# Patient Record
Sex: Female | Born: 1980 | Race: White | Hispanic: No | Marital: Married | Smoking: Former smoker
Health system: Southern US, Community
[De-identification: ages and names within clinical notes are randomized; demographics above are authoritative.]

## PROBLEM LIST (undated history)

## (undated) HISTORY — PX: KNEE ARTHROPLASTY: SHX992

---

## 2006-12-16 ENCOUNTER — Emergency Department (HOSPITAL_COMMUNITY): Admission: EM | Admit: 2006-12-16 | Discharge: 2006-12-17 | Payer: Self-pay | Admitting: Emergency Medicine

## 2009-02-12 ENCOUNTER — Ambulatory Visit: Payer: Self-pay | Admitting: Radiology

## 2009-02-12 ENCOUNTER — Emergency Department (HOSPITAL_BASED_OUTPATIENT_CLINIC_OR_DEPARTMENT_OTHER): Admission: EM | Admit: 2009-02-12 | Discharge: 2009-02-12 | Payer: Self-pay | Admitting: Emergency Medicine

## 2010-03-26 ENCOUNTER — Emergency Department (HOSPITAL_BASED_OUTPATIENT_CLINIC_OR_DEPARTMENT_OTHER): Admission: EM | Admit: 2010-03-26 | Discharge: 2010-03-26 | Payer: Self-pay | Admitting: Emergency Medicine

## 2010-03-26 ENCOUNTER — Ambulatory Visit: Payer: Self-pay | Admitting: Diagnostic Radiology

## 2010-04-14 ENCOUNTER — Ambulatory Visit: Payer: Self-pay | Admitting: Radiology

## 2010-04-14 ENCOUNTER — Emergency Department (HOSPITAL_BASED_OUTPATIENT_CLINIC_OR_DEPARTMENT_OTHER): Admission: EM | Admit: 2010-04-14 | Discharge: 2010-04-14 | Payer: Self-pay | Admitting: Emergency Medicine

## 2011-07-17 ENCOUNTER — Emergency Department (INDEPENDENT_AMBULATORY_CARE_PROVIDER_SITE_OTHER): Payer: Medicaid Other

## 2011-07-17 ENCOUNTER — Encounter: Payer: Self-pay | Admitting: *Deleted

## 2011-07-17 ENCOUNTER — Emergency Department (HOSPITAL_BASED_OUTPATIENT_CLINIC_OR_DEPARTMENT_OTHER)
Admission: EM | Admit: 2011-07-17 | Discharge: 2011-07-17 | Disposition: A | Discharge status: Home / Self Care | Payer: Medicaid Other | Attending: Emergency Medicine | Admitting: Emergency Medicine

## 2011-07-17 DIAGNOSIS — W19XXXA Unspecified fall, initial encounter: Secondary | ICD-10-CM

## 2011-07-17 DIAGNOSIS — IMO0002 Reserved for concepts with insufficient information to code with codable children: Secondary | ICD-10-CM | POA: Insufficient documentation

## 2011-07-17 DIAGNOSIS — M25519 Pain in unspecified shoulder: Secondary | ICD-10-CM

## 2011-07-17 DIAGNOSIS — S46919A Strain of unspecified muscle, fascia and tendon at shoulder and upper arm level, unspecified arm, initial encounter: Secondary | ICD-10-CM

## 2011-07-17 DIAGNOSIS — Y9239 Other specified sports and athletic area as the place of occurrence of the external cause: Secondary | ICD-10-CM | POA: Insufficient documentation

## 2011-07-17 DIAGNOSIS — Y92838 Other recreation area as the place of occurrence of the external cause: Secondary | ICD-10-CM | POA: Insufficient documentation

## 2011-07-17 DIAGNOSIS — Y9351 Activity, roller skating (inline) and skateboarding: Secondary | ICD-10-CM | POA: Insufficient documentation

## 2011-07-17 NOTE — ED Provider Notes (Signed)
History     CSN: [id number] Arrival date & time: 07/17/2011  9:23 PM  Chief Complaint  Patient presents with  . Shoulder Injury   Patient is a 30 y.o. female presenting with shoulder injury. The history is provided by the patient.  Shoulder Injury  Pt was skating in a roller derby just prior to arrival when she fell over and landed on her R shoulder. Heard a pop, complaining of moderate aching pain in R shoulder, worse with movement. No head injury or LOC. Pain is moderate and aching.   History reviewed. No pertinent past medical history.  Past Surgical History  Procedure Date  . Cesarean section     History reviewed. No pertinent family history.  History  Substance Use Topics  . Smoking status: Current Everyday Smoker  . Smokeless tobacco: Not on file  . Alcohol Use: No    OB History    Grav Para Term Preterm Abortions TAB SAB Ect Mult Living                  Review of Systems All other systems reviewed and are negative except as noted in HPI.   Physical Exam  BP 125/73  Pulse 68  Temp(Src) 97.8 F (36.6 C) (Oral)  Resp 20  Ht 5\' 8"  (1.727 m)  Wt 272 lb (123.378 kg)  BMI 41.36 kg/m2  SpO2 100%  LMP 07/17/2011  Physical Exam  Nursing note and vitals reviewed. Constitutional: She is oriented to person, place, and time. She appears well-developed and well-nourished.  HENT:  Head: Normocephalic and atraumatic.  Eyes: EOM are normal. Pupils are equal, round, and reactive to light.  Neck: Normal range of motion. Neck supple.  Cardiovascular: Normal rate, normal heart sounds and intact distal pulses.   Pulmonary/Chest: Effort normal and breath sounds normal.  Abdominal: Bowel sounds are normal. She exhibits no distension. There is no tenderness.  Musculoskeletal: She exhibits tenderness. She exhibits no edema.       Tender over the R shoulder soft tissue, no focal bony or AC joint tenderness, no deformity, ROM limited by pain  Neurological: She is alert and  oriented to person, place, and time. No cranial nerve deficit.  Skin: Skin is warm and dry. No rash noted.  Psychiatric: She has a normal mood and affect.    ED Course  Procedures  MDM Xray neg for bony injury. Sling applied by nurse, pt declines pain meds. Advised ortho followup if not improving.       Mazzie Brodrick B. Bernette Mayers, MD 07/17/11 [phone]

## 2011-07-17 NOTE — ED Notes (Signed)
Pt states she participates on a roller derby team and was tripped, landing on her right shoulder. Felt pop. +radial pulse. Ice applied at time of incident.

## 2011-10-27 ENCOUNTER — Emergency Department (INDEPENDENT_AMBULATORY_CARE_PROVIDER_SITE_OTHER): Payer: Medicaid Other

## 2011-10-27 ENCOUNTER — Encounter (HOSPITAL_BASED_OUTPATIENT_CLINIC_OR_DEPARTMENT_OTHER): Payer: Self-pay | Admitting: *Deleted

## 2011-10-27 ENCOUNTER — Emergency Department (HOSPITAL_BASED_OUTPATIENT_CLINIC_OR_DEPARTMENT_OTHER)
Admission: EM | Admit: 2011-10-27 | Discharge: 2011-10-27 | Disposition: A | Discharge status: Home / Self Care | Payer: Medicaid Other | Attending: Emergency Medicine | Admitting: Emergency Medicine

## 2011-10-27 DIAGNOSIS — R05 Cough: Secondary | ICD-10-CM

## 2011-10-27 DIAGNOSIS — F17200 Nicotine dependence, unspecified, uncomplicated: Secondary | ICD-10-CM | POA: Insufficient documentation

## 2011-10-27 DIAGNOSIS — R059 Cough, unspecified: Secondary | ICD-10-CM | POA: Insufficient documentation

## 2011-10-27 MED ORDER — HYDROCOD POLST-CHLORPHEN POLST 10-8 MG/5ML PO LQCR: 5.0000 mL | Freq: Two times a day (BID) | ORAL | Status: DC | PRN

## 2011-10-27 NOTE — ED Notes (Signed)
Patient transported to X-ray 

## 2011-10-27 NOTE — ED Notes (Signed)
Secondary assessment-  Pt reports a 2 week history of coughing.  Pt reports a productive cough that has worsened over the past few days.  Denies fever or other symptoms.

## 2011-10-27 NOTE — ED Notes (Signed)
Pt c/o cough x1 week productive of yellow sputum. Pt denies fever.

## 2011-10-27 NOTE — ED Provider Notes (Signed)
History     CSN: [id number] Arrival date & time: 10/27/2011  3:06 PM   First MD Initiated Contact with Patient 10/27/11 1513      Chief Complaint  Patient presents with  . Cough    (Consider location/radiation/quality/duration/timing/severity/associated sxs/prior treatment) Patient is a 30 y.o. female presenting with cough. The history is provided by the patient.  Cough This is a new problem. The current episode started more than 1 week ago. The problem occurs constantly. The problem has not changed since onset.The cough is productive of sputum. There has been no fever. Associated symptoms include rhinorrhea and wheezing. Pertinent negatives include no sore throat and no shortness of breath. She has tried cough syrup for the symptoms. She is not a smoker. Her past medical history is significant for pneumonia.    History reviewed. No pertinent past medical history.  Past Surgical History  Procedure Date  . Cesarean section     No family history on file.  History  Substance Use Topics  . Smoking status: Current Everyday Smoker  . Smokeless tobacco: Not on file  . Alcohol Use: No    OB History    Grav Para Term Preterm Abortions TAB SAB Ect Mult Living                  Review of Systems  HENT: Positive for rhinorrhea. Negative for sore throat.   Respiratory: Positive for cough and wheezing. Negative for shortness of breath.   All other systems reviewed and are negative.    Allergies  Review of patient's allergies indicates no known allergies.  Home Medications   Current Outpatient Rx  Name Route Sig Dispense Refill  . HYDROCOD POLST-CHLORPHEN POLST 10-8 MG/5ML PO LQCR Oral Take 5 mLs by mouth every 12 (twelve) hours as needed. 140 mL 0  . IBUPROFEN 200 MG PO TABS Oral Take 600 mg by mouth once as needed. For pain       BP 125/78  Pulse 68  Temp(Src) 98.3 F (36.8 C) (Oral)  Resp 20  SpO2 100%  LMP 10/13/2011  Physical Exam  Vitals  reviewed. Constitutional: She is oriented to person, place, and time. She appears well-developed and well-nourished.  HENT:  Right Ear: External ear normal.  Left Ear: External ear normal.  Mouth/Throat: Oropharynx is clear and moist.  Cardiovascular: Normal rate and regular rhythm.   Pulmonary/Chest: Effort normal and breath sounds normal.  Neurological: She is alert and oriented to person, place, and time.  Skin: Skin is warm and dry.  Psychiatric: She has a normal mood and affect.    ED Course  Procedures (including critical care time)  Labs Reviewed - No data to display Dg Chest 2 View  10/27/2011  *RADIOLOGY REPORT*  Clinical Data: Productive cough  CHEST - 2 VIEW  Comparison: 02/12/2009  Findings: Heart size is upper normal.  Negative for heart failure. Lungs are clear without infiltrate or effusion.  Negative for mass or pneumonia.  IMPRESSION: No active cardiopulmonary disease.  Original Report Authenticated By: Camelia Phenes, M.D.     1. Cough       MDM  No sign of infection;will treat symptomatically        Teressa Lower, NP 10/27/11 1550

## 2011-10-28 NOTE — ED Provider Notes (Signed)
Medical screening examination/treatment/procedure(s) were performed by non-physician practitioner and as supervising physician I was immediately available for consultation/collaboration.   Solita Macadam, MD 10/28/11 0004 

## 2014-06-26 ENCOUNTER — Encounter (HOSPITAL_BASED_OUTPATIENT_CLINIC_OR_DEPARTMENT_OTHER): Payer: Self-pay | Admitting: Emergency Medicine

## 2014-06-26 ENCOUNTER — Emergency Department (HOSPITAL_BASED_OUTPATIENT_CLINIC_OR_DEPARTMENT_OTHER)
Admission: EM | Admit: 2014-06-26 | Discharge: 2014-06-26 | Disposition: A | Discharge status: Home / Self Care | Payer: BC Managed Care – PPO | Attending: Emergency Medicine | Admitting: Emergency Medicine

## 2014-06-26 DIAGNOSIS — M25562 Pain in left knee: Secondary | ICD-10-CM

## 2014-06-26 DIAGNOSIS — Y9289 Other specified places as the place of occurrence of the external cause: Secondary | ICD-10-CM | POA: Insufficient documentation

## 2014-06-26 DIAGNOSIS — F17200 Nicotine dependence, unspecified, uncomplicated: Secondary | ICD-10-CM | POA: Insufficient documentation

## 2014-06-26 DIAGNOSIS — M7040 Prepatellar bursitis, unspecified knee: Secondary | ICD-10-CM | POA: Insufficient documentation

## 2014-06-26 DIAGNOSIS — S99919A Unspecified injury of unspecified ankle, initial encounter: Principal | ICD-10-CM

## 2014-06-26 DIAGNOSIS — Y9389 Activity, other specified: Secondary | ICD-10-CM | POA: Insufficient documentation

## 2014-06-26 DIAGNOSIS — R296 Repeated falls: Secondary | ICD-10-CM | POA: Insufficient documentation

## 2014-06-26 DIAGNOSIS — M7042 Prepatellar bursitis, left knee: Secondary | ICD-10-CM

## 2014-06-26 DIAGNOSIS — S99929A Unspecified injury of unspecified foot, initial encounter: Principal | ICD-10-CM

## 2014-06-26 DIAGNOSIS — S8990XA Unspecified injury of unspecified lower leg, initial encounter: Secondary | ICD-10-CM | POA: Insufficient documentation

## 2014-06-26 NOTE — ED Notes (Signed)
MD at bedside. 

## 2014-06-26 NOTE — ED Provider Notes (Signed)
CSN: [id number]     Arrival date & time 06/26/14  1046 History   First MD Initiated Contact with Patient 06/26/14 1103     Chief Complaint  Patient presents with  . Knee Pain     (Consider location/radiation/quality/duration/timing/severity/associated sxs/prior Treatment) HPI Comments: Patient is a 33 year old female who presents with left knee pain. This started 1 week ago after a fall. It is worse when she lies down, however she has no discomfort while walking.  Patient is a 33 y.o. female presenting with knee pain. The history is provided by the patient.  Knee Pain Location:  Knee Time since incident:  7 days Injury: yes   Knee location:  L knee Pain details:    Quality:  Sharp   Radiates to:  Does not radiate   Severity:  Moderate   Onset quality:  Sudden   Timing:  Constant   Progression:  Unchanged Chronicity:  New Relieved by:  NSAIDs Exacerbated by: Direct pressure.   History reviewed. No pertinent past medical history. Past Surgical History  Procedure Laterality Date  . Cesarean section     No family history on file. History  Substance Use Topics  . Smoking status: Current Every Day Smoker  . Smokeless tobacco: Not on file  . Alcohol Use: No   OB History   Grav Para Term Preterm Abortions TAB SAB Ect Mult Living                 Review of Systems  All other systems reviewed and are negative.     Allergies  Review of patient's allergies indicates no known allergies.  Home Medications   Prior to Admission medications   Medication Sig Start Date End Date Taking? Authorizing Provider  chlorpheniramine-HYDROcodone (TUSSIONEX PENNKINETIC ER) 10-8 MG/5ML LQCR Take 5 mLs by mouth every 12 (twelve) hours as needed. 10/27/11   Teressa LowerVrinda Pickering, NP  ibuprofen (ADVIL,MOTRIN) 200 MG tablet Take 600 mg by mouth once as needed. For pain     Historical Provider, MD   BP 128/77  Pulse 56  Temp(Src) 98.8 F (37.1 C) (Oral)  Resp 16  Ht 5\' 8"  (1.727 m)  Wt  300 lb (136.079 kg)  BMI 45.63 kg/m2  SpO2 100%  LMP 06/10/2014 Physical Exam  Nursing note and vitals reviewed. Constitutional: She is oriented to person, place, and time. She appears well-developed and well-nourished. No distress.  HENT:  Head: Normocephalic and atraumatic.  Musculoskeletal: Normal range of motion.  The left knee appears grossly normal. There is slight swelling anterior to the patella. She has full range of motion without significant discomfort. There is no laxity anterior/posterior or laterally.  Neurological: She is alert and oriented to person, place, and time.  Skin: Skin is warm and dry. She is not diaphoretic.    ED Course  Procedures (including critical care time) Labs Review Labs Reviewed - No data to display  Imaging Review No results found.   EKG Interpretation None      MDM   Final diagnoses:  None    This appears to be a prepatellar bursitis which will be treated with compressive wrap, NSAIDs, and rest. If not improving in the next week, she is to followup with her primary Dr.    Geoffery Lyonsouglas Chisa Kushner, MD 06/26/14 [phone]

## 2014-06-26 NOTE — Discharge Instructions (Signed)
Ibuprofen 600 mg 3 times daily for the next 5 days.  Wear Ace bandages applied.  Followup with your primary Dr. if not improving in the next week, and return to the ER if you develop severe pain, redness, high fever, or other new and concerning symptoms.   Bursitis Bursitis is a swelling and soreness (inflammation) of a fluid-filled sac (bursa) that overlies and protects a joint. It can be caused by injury, overuse of the joint, arthritis or infection. The joints most likely to be affected are the elbows, shoulders, hips and knees. HOME CARE INSTRUCTIONS   Apply ice to the affected area for 15-20 minutes each hour while awake for 2 days. Put the ice in a plastic bag and place a towel between the bag of ice and your skin.  Rest the injured joint as much as possible, but continue to put the joint through a full range of motion, 4 times per day. (The shoulder joint especially becomes rapidly "frozen" if not used.) When the pain lessens, begin normal slow movements and usual activities.  Only take over-the-counter or prescription medicines for pain, discomfort or fever as directed by your caregiver.  Your caregiver may recommend draining the bursa and injecting medicine into the bursa. This may help the healing process.  Follow all instructions for follow-up with your caregiver. This includes any orthopedic referrals, physical therapy and rehabilitation. Any delay in obtaining necessary care could result in a delay or failure of the bursitis to heal and chronic pain. SEEK IMMEDIATE MEDICAL CARE IF:   Your pain increases even during treatment.  You develop an oral temperature above 102 F (38.9 C) and have heat and inflammation over the involved bursa. MAKE SURE YOU:   Understand these instructions.  Will watch your condition.  Will get help right away if you are not doing well or get worse. Document Released: 11/19/2000 Document Revised: 02/14/2012 Document Reviewed:  10/24/2009 [facility] Patient Information 2015 [employer] [place]. This information is not intended to replace advice given to you by your health care provider. Make sure you discuss any questions you have with your health care provider.

## 2014-06-26 NOTE — ED Notes (Signed)
Reports fall to left knee. Increased pain. Denies pain when walking

## 2015-10-04 ENCOUNTER — Emergency Department (HOSPITAL_BASED_OUTPATIENT_CLINIC_OR_DEPARTMENT_OTHER)
Admission: EM | Admit: 2015-10-04 | Discharge: 2015-10-04 | Disposition: A | Discharge status: Home / Self Care | Payer: No Typology Code available for payment source | Attending: Emergency Medicine | Admitting: Emergency Medicine

## 2015-10-04 ENCOUNTER — Encounter (HOSPITAL_BASED_OUTPATIENT_CLINIC_OR_DEPARTMENT_OTHER): Payer: Self-pay | Admitting: *Deleted

## 2015-10-04 DIAGNOSIS — R200 Anesthesia of skin: Secondary | ICD-10-CM | POA: Insufficient documentation

## 2015-10-04 DIAGNOSIS — S46911A Strain of unspecified muscle, fascia and tendon at shoulder and upper arm level, right arm, initial encounter: Secondary | ICD-10-CM | POA: Insufficient documentation

## 2015-10-04 DIAGNOSIS — S199XXA Unspecified injury of neck, initial encounter: Secondary | ICD-10-CM | POA: Insufficient documentation

## 2015-10-04 DIAGNOSIS — Y998 Other external cause status: Secondary | ICD-10-CM | POA: Diagnosis not present

## 2015-10-04 DIAGNOSIS — Y92410 Unspecified street and highway as the place of occurrence of the external cause: Secondary | ICD-10-CM | POA: Diagnosis not present

## 2015-10-04 DIAGNOSIS — Z720 Tobacco use: Secondary | ICD-10-CM | POA: Insufficient documentation

## 2015-10-04 DIAGNOSIS — S4991XA Unspecified injury of right shoulder and upper arm, initial encounter: Secondary | ICD-10-CM | POA: Diagnosis present

## 2015-10-04 DIAGNOSIS — Y9389 Activity, other specified: Secondary | ICD-10-CM | POA: Insufficient documentation

## 2015-10-04 DIAGNOSIS — S46811A Strain of other muscles, fascia and tendons at shoulder and upper arm level, right arm, initial encounter: Secondary | ICD-10-CM

## 2015-10-04 MED ORDER — IBUPROFEN 600 MG PO TABS: 600.0000 mg | ORAL_TABLET | Freq: Three times a day (TID) | ORAL | Status: DC | PRN

## 2015-10-04 MED ORDER — CYCLOBENZAPRINE HCL 10 MG PO TABS: 10.0000 mg | ORAL_TABLET | Freq: Three times a day (TID) | ORAL | Status: DC | PRN
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
## 2015-10-04 NOTE — ED Notes (Signed)
C/o rt shoulder pain, feels like having numbness down to rt hand
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
## 2016-05-07 ENCOUNTER — Emergency Department (HOSPITAL_BASED_OUTPATIENT_CLINIC_OR_DEPARTMENT_OTHER)
Admission: EM | Admit: 2016-05-07 | Discharge: 2016-05-07 | Disposition: A | Discharge status: Home / Self Care | Payer: No Typology Code available for payment source | Attending: Emergency Medicine | Admitting: Emergency Medicine

## 2016-05-07 ENCOUNTER — Encounter (HOSPITAL_BASED_OUTPATIENT_CLINIC_OR_DEPARTMENT_OTHER): Payer: Self-pay | Admitting: *Deleted

## 2016-05-07 DIAGNOSIS — J069 Acute upper respiratory infection, unspecified: Secondary | ICD-10-CM | POA: Insufficient documentation

## 2016-05-07 DIAGNOSIS — F17200 Nicotine dependence, unspecified, uncomplicated: Secondary | ICD-10-CM | POA: Insufficient documentation

## 2016-05-07 LAB — RAPID STREP SCREEN (MED CTR MEBANE ONLY): Streptococcus, Group A Screen (Direct): NEGATIVE

## 2016-05-07 NOTE — Discharge Instructions (Signed)

## 2016-05-07 NOTE — ED Notes (Signed)
MD at bedside. 

## 2016-05-07 NOTE — ED Provider Notes (Signed)
CSN: [id number]     Arrival date & time 05/07/16  0913 History   First MD Initiated Contact with Patient 05/07/16 0920     Chief Complaint  Patient presents with  . Sore Throat     (Consider location/radiation/quality/duration/timing/severity/associated sxs/prior Treatment) HPI  He patient is a 35 year old female, she presents with a cough and a sore throat which started approximately 5 days ago, is persistent, her sore throat persist over cough is improved. She has no fevers, no vomiting, denies any swollen lymph nodes. She has taken over-the-counter medications with some relief  History reviewed. No pertinent past medical history. Past Surgical History  Procedure Laterality Date  . Cesarean section     No family history on file. Social History  Substance Use Topics  . Smoking status: Current Every Day Smoker  . Smokeless tobacco: None  . Alcohol Use: No   OB History    No data available     Review of Systems  Constitutional: Negative for fever.  HENT: Positive for sore throat.       Allergies  Review of patient's allergies indicates no known allergies.  Home Medications   Prior to Admission medications   Medication Sig Start Date End Date Taking? Authorizing Provider  chlorpheniramine-HYDROcodone (TUSSIONEX PENNKINETIC ER) 10-8 MG/5ML LQCR Take 5 mLs by mouth every 12 (twelve) hours as needed. 10/27/11   Teressa LowerVrinda Pickering, NP  cyclobenzaprine (FLEXERIL) 10 MG tablet Take 1 tablet (10 mg total) by mouth 3 (three) times daily as needed for muscle spasms. 10/04/15   Pricilla LovelessScott Goldston, MD  ibuprofen (ADVIL,MOTRIN) 600 MG tablet Take 1 tablet (600 mg total) by mouth every 8 (eight) hours as needed. 10/04/15   Pricilla LovelessScott Goldston, MD   BP 132/73 mmHg  Pulse 84  Temp(Src) 98.5 F (36.9 C) (Oral)  Resp 18  Ht 5\' 8"  (1.727 m)  Wt 314 lb (142.429 kg)  BMI 47.75 kg/m2  SpO2 99% Physical Exam  Constitutional: She appears well-developed and well-nourished.  HENT:  Head:  Normocephalic and atraumatic.  Uvula is midline, no asymmetry exudate hypertrophy or erythema, phonation is normal, mucous membranes are moist  Eyes: Conjunctivae are normal. Right eye exhibits no discharge. Left eye exhibits no discharge.  Pulmonary/Chest: Effort normal. No respiratory distress.  Neurological: She is alert. Coordination normal.  Skin: Skin is warm and dry. No rash noted. She is not diaphoretic. No erythema.  Psychiatric: She has a normal mood and affect.  Nursing note and vitals reviewed.   ED Course  Procedures (including critical care time) Labs Review Labs Reviewed  RAPID STRE[facility]EN (NOT [facility])  CULTURE, GROUP A STREP [facility])    Imaging Review No results found. I have personally reviewed and evaluated these images and lab results as part of my medical decision-making.   EKG Interpretation None      MDM   Final diagnoses:  URI (upper respiratory infection)    Well-appearing, no lymphadenopathy, no fever, less likely strep, nursing has ordered rapid strep prior to my evaluation  Strep neg Supportive care Pt in agreement   Eber HongBrian Issaih Kaus, MD 05/07/16 1003

## 2016-05-07 NOTE — ED Notes (Signed)
This past Monday, began having a sore throat and have a non-prod, dry coughs. Has been taking cough syrup to aid in controlling cough

## 2016-05-09 LAB — CULTURE, GROUP A STREP (THRC)

## 2016-08-04 ENCOUNTER — Encounter (HOSPITAL_BASED_OUTPATIENT_CLINIC_OR_DEPARTMENT_OTHER): Payer: Self-pay | Admitting: *Deleted

## 2016-08-04 ENCOUNTER — Emergency Department (HOSPITAL_BASED_OUTPATIENT_CLINIC_OR_DEPARTMENT_OTHER): Payer: Worker's Compensation

## 2016-08-04 ENCOUNTER — Emergency Department (HOSPITAL_BASED_OUTPATIENT_CLINIC_OR_DEPARTMENT_OTHER)
Admission: EM | Admit: 2016-08-04 | Discharge: 2016-08-05 | Disposition: A | Discharge status: Home / Self Care | Payer: Worker's Compensation | Attending: Emergency Medicine | Admitting: Emergency Medicine

## 2016-08-04 DIAGNOSIS — Y999 Unspecified external cause status: Secondary | ICD-10-CM | POA: Diagnosis not present

## 2016-08-04 DIAGNOSIS — Y9389 Activity, other specified: Secondary | ICD-10-CM | POA: Insufficient documentation

## 2016-08-04 DIAGNOSIS — M7918 Myalgia, other site: Secondary | ICD-10-CM

## 2016-08-04 DIAGNOSIS — S8991XA Unspecified injury of right lower leg, initial encounter: Secondary | ICD-10-CM | POA: Diagnosis present

## 2016-08-04 DIAGNOSIS — R079 Chest pain, unspecified: Secondary | ICD-10-CM | POA: Insufficient documentation

## 2016-08-04 DIAGNOSIS — F17200 Nicotine dependence, unspecified, uncomplicated: Secondary | ICD-10-CM | POA: Insufficient documentation

## 2016-08-04 DIAGNOSIS — R109 Unspecified abdominal pain: Secondary | ICD-10-CM | POA: Insufficient documentation

## 2016-08-04 DIAGNOSIS — Z23 Encounter for immunization: Secondary | ICD-10-CM | POA: Diagnosis not present

## 2016-08-04 DIAGNOSIS — Y92410 Unspecified street and highway as the place of occurrence of the external cause: Secondary | ICD-10-CM | POA: Diagnosis not present

## 2016-08-04 DIAGNOSIS — S81011A Laceration without foreign body, right knee, initial encounter: Secondary | ICD-10-CM | POA: Diagnosis not present

## 2016-08-04 DIAGNOSIS — M542 Cervicalgia: Secondary | ICD-10-CM | POA: Insufficient documentation

## 2016-08-04 LAB — CBC
HEMATOCRIT: 37.9 % (ref 36.0–46.0)
HEMOGLOBIN: 12.4 g/dL (ref 12.0–15.0)
MCH: 24.3 pg — AB (ref 26.0–34.0)
MCHC: 32.7 g/dL (ref 30.0–36.0)
MCV: 74.2 fL — ABNORMAL LOW (ref 78.0–100.0)
Platelets: 305 10*3/uL (ref 150–400)
RBC: 5.11 MIL/uL (ref 3.87–5.11)
RDW: 14.8 % (ref 11.5–15.5)
WBC: 20 10*3/uL — ABNORMAL HIGH (ref 4.0–10.5)

## 2016-08-04 LAB — COMPREHENSIVE METABOLIC PANEL
ALK PHOS: 54 U/L (ref 38–126)
ALT: 17 U/L (ref 14–54)
ANION GAP: 8 (ref 5–15)
AST: 21 U/L (ref 15–41)
Albumin: 4.1 g/dL (ref 3.5–5.0)
BILIRUBIN TOTAL: 0.3 mg/dL (ref 0.3–1.2)
BUN: 12 mg/dL (ref 6–20)
CALCIUM: 8.9 mg/dL (ref 8.9–10.3)
CO2: 24 mmol/L (ref 22–32)
CREATININE: 0.93 mg/dL (ref 0.44–1.00)
Chloride: 106 mmol/L (ref 101–111)
GFR calc non Af Amer: >60 mL/min (ref >60)
GLUCOSE: 133 mg/dL — AB (ref 65–99)
Potassium: 3.6 mmol/L (ref 3.5–5.1)
Sodium: 138 mmol/L (ref 135–145)
TOTAL PROTEIN: 7.4 g/dL (ref 6.5–8.1)

## 2016-08-04 LAB — HCG, SERUM, QUALITATIVE: Preg, Serum: NEGATIVE

## 2016-08-04 MED ORDER — SODIUM CHLORIDE 0.9 % IV SOLN
Freq: Once | INTRAVENOUS | Status: AC
  Administered 2016-08-04: 21:00:00 via INTRAVENOUS

## 2016-08-04 MED ORDER — LIDOCAINE-EPINEPHRINE (PF) 2 %-1:200000 IJ SOLN
10.0000 mL | Freq: Once | INTRAMUSCULAR | Status: AC
  Administered 2016-08-05: 10 mL
  Filled 2016-08-04: qty 10

## 2016-08-04 MED ORDER — IOPAMIDOL (ISOVUE-370) INJECTION 76%
100.0000 mL | Freq: Once | INTRAVENOUS | Status: AC | PRN
  Administered 2016-08-04: 100 mL via INTRAVENOUS

## 2016-08-04 MED ORDER — TETANUS-DIPHTH-ACELL PERTUSSIS 5-2.5-18.5 LF-MCG/0.5 IM SUSP
0.5000 mL | Freq: Once | INTRAMUSCULAR | Status: AC
  Administered 2016-08-04: 0.5 mL via INTRAMUSCULAR
  Filled 2016-08-04: qty 0.5
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
## 2016-08-05 MED ORDER — OXYCODONE-ACETAMINOPHEN 5-325 MG PO TABS: 1.0000 | ORAL_TABLET | Freq: Four times a day (QID) | ORAL | 0 refills | Status: DC | PRN
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
## 2016-08-16 ENCOUNTER — Encounter: Payer: Self-pay | Admitting: Family Medicine

## 2016-08-16 ENCOUNTER — Ambulatory Visit (INDEPENDENT_AMBULATORY_CARE_PROVIDER_SITE_OTHER): Payer: Worker's Compensation | Admitting: Family Medicine

## 2016-08-16 DIAGNOSIS — S8991XA Unspecified injury of right lower leg, initial encounter: Secondary | ICD-10-CM | POA: Diagnosis not present

## 2016-08-16 NOTE — Patient Instructions (Signed)
Your exam is reassuring. You have a severe knee contusion and laceration. Keep this wrapped and covered until it crusts over. Icing 15 minutes at a time 3-4 times a day. Ibuprofen 600mg  three times a day with food for pain and inflammation as needed. Knee brace for support - stop using the immobilizer. Crutches only if needed. Straight leg raises, knee extensions 3 sets of 10 once a day. Elevate as needed for swelling. Follow up with me in about 1 month for reevaluation. It's ok to work as you have been.

## 2016-08-17 DIAGNOSIS — S8991XA Unspecified injury of right lower leg, initial encounter: Secondary | ICD-10-CM | POA: Insufficient documentation

## 2016-08-17 NOTE — Assessment & Plan Note (Signed)
2/2 severe contusion and laceration.  Continue with non-stick bandage, wrapping.  Icing, nsaids.  Switch to knee brace from immobilizer.  Crutches if needed.  Shown home exercises to do daily.  F/u in 1 month.
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
## 2016-09-16 ENCOUNTER — Ambulatory Visit (INDEPENDENT_AMBULATORY_CARE_PROVIDER_SITE_OTHER): Payer: Worker's Compensation | Admitting: Family Medicine

## 2016-09-16 ENCOUNTER — Encounter: Payer: Self-pay | Admitting: Family Medicine

## 2016-09-16 DIAGNOSIS — S8991XD Unspecified injury of right lower leg, subsequent encounter: Secondary | ICD-10-CM | POA: Diagnosis not present

## 2016-09-16 NOTE — Patient Instructions (Addendum)
Follow up with me in 2 months for likely final visit. Use knee brace as needed at this point (work, long walking).

## 2016-09-19 NOTE — Assessment & Plan Note (Signed)
2/2 severe contusion and laceration.  Healing well.  She does still have tenderness anteriorly including joint lines but meniscus testing is negative and mechanism not typical for a tear.  She is much better clinically also.  Continue knee brace as needed.  Icing or NSAIDs only if needed.  F/u in 2 months for likely final visit.
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
## 2016-11-16 ENCOUNTER — Ambulatory Visit (INDEPENDENT_AMBULATORY_CARE_PROVIDER_SITE_OTHER): Payer: Worker's Compensation | Admitting: Family Medicine

## 2016-11-16 ENCOUNTER — Encounter: Payer: Self-pay | Admitting: Family Medicine

## 2016-11-16 VITALS — BP 129/78 | HR 64 | Ht 68.0 in | Wt 311.0 lb

## 2016-11-16 DIAGNOSIS — S8991XD Unspecified injury of right lower leg, subsequent encounter: Secondary | ICD-10-CM | POA: Diagnosis not present

## 2016-11-16 NOTE — Patient Instructions (Signed)
I'm concerned you have a meniscus tear of this knee given you are not improving as expected, the knee is buckling and catching on you. A majority of these heal on their own or the pain resolves but yours has not done so to date if you do have one. We will go ahead with an MRI to further assess. No other changes to the treatment plan - I will call you the business day following the MRI to go over results and next steps.
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
## 2016-11-17 NOTE — Assessment & Plan Note (Signed)
Unfortunately not improving as much as expected and now getting giving out, locking of the knee.  Medial tenderness - concerning for possible medial meniscus tear with flap component in addition to her underlying contusion.  Will go ahead with MRI to further assess.  Icing or NSAIDs only if needed.

## 2016-11-23 ENCOUNTER — Telehealth: Payer: Self-pay | Admitting: Family Medicine

## 2016-11-24 ENCOUNTER — Ambulatory Visit (INDEPENDENT_AMBULATORY_CARE_PROVIDER_SITE_OTHER): Payer: Worker's Compensation | Admitting: Family Medicine

## 2016-11-24 ENCOUNTER — Encounter: Payer: Self-pay | Admitting: Family Medicine

## 2016-11-24 VITALS — BP 119/75 | HR 60 | Ht 68.0 in | Wt 310.0 lb

## 2016-11-24 DIAGNOSIS — S8991XD Unspecified injury of right lower leg, subsequent encounter: Secondary | ICD-10-CM

## 2016-11-24 MED ORDER — METHYLPREDNISOLONE ACETATE 40 MG/ML IJ SUSP
40.0000 mg | Freq: Once | INTRAMUSCULAR | Status: AC
  Administered 2016-11-24: 40 mg via INTRA_ARTICULAR

## 2016-11-24 NOTE — Telephone Encounter (Signed)
We reviewed results yesterday afternoon - will put addendum to her last note.

## 2016-11-25 ENCOUNTER — Telehealth: Payer: Self-pay | Admitting: Family Medicine

## 2016-11-25 NOTE — Telephone Encounter (Signed)
Faxed required information to both fax numbers/ls

## 2016-11-25 NOTE — Addendum Note (Signed)
Addended by: Kathi SimpersWISE, Gill Delrossi F on: 11/25/2016 04:46 PM   Modules accepted: Orders
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
## 2016-11-25 NOTE — Assessment & Plan Note (Signed)
MRI showed a small 6mm osteochondral defect lateral femoral condyle with edema.  We discussed conservative vs surgical treatment for these - given how small this is hopefully she will respond to conservative treatment - injection given today and will order physical therapy as well.  Doubt at this point that several weeks of having her non weight bearing will be beneficial for healing of this.  After informed written consent, patient was seated on exam table. Right knee was prepped with alcohol swab and utilizing anteromedial approach, patient's right knee was injected intraarticularly with 3:1 bupivicaine: depomedrol. Patient tolerated the procedure well without immediate complications.

## 2016-11-26 ENCOUNTER — Encounter: Payer: Self-pay | Admitting: Family Medicine

## 2017-01-18 ENCOUNTER — Encounter: Payer: Self-pay | Admitting: Family Medicine

## 2017-01-18 ENCOUNTER — Ambulatory Visit (INDEPENDENT_AMBULATORY_CARE_PROVIDER_SITE_OTHER): Payer: Worker's Compensation | Admitting: Family Medicine

## 2017-01-18 DIAGNOSIS — S8991XD Unspecified injury of right lower leg, subsequent encounter: Secondary | ICD-10-CM
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
## 2017-01-19 NOTE — Assessment & Plan Note (Signed)
MRI showed a small 6mm osteochondral defect lateral femoral condyle with edema.  She has done very well with intraarticular injection, exercises.  Plan to see her back in 3 months to reevaluate - if doing well at that point hopefully can release her from care.  Tylenol or motrin if needed, icing only if needed also.

## 2017-01-21 ENCOUNTER — Telehealth: Payer: Self-pay | Admitting: Family Medicine

## 2017-01-21 NOTE — Telephone Encounter (Signed)
Spoke to patient and she is not doing PT.

## 2017-01-21 NOTE — Telephone Encounter (Signed)
Note written that she is full duty without restrictions.  She is not going to PT.

## 2017-01-21 NOTE — Telephone Encounter (Signed)
I have note but there is no mention of whether she is supposed to go to PT (?) please see original message and let me know if I should fax as is, thanks

## 2017-03-19 ENCOUNTER — Encounter (HOSPITAL_BASED_OUTPATIENT_CLINIC_OR_DEPARTMENT_OTHER): Payer: Self-pay | Admitting: Emergency Medicine

## 2017-03-19 ENCOUNTER — Emergency Department (HOSPITAL_BASED_OUTPATIENT_CLINIC_OR_DEPARTMENT_OTHER): Payer: Worker's Compensation

## 2017-03-19 ENCOUNTER — Emergency Department (HOSPITAL_BASED_OUTPATIENT_CLINIC_OR_DEPARTMENT_OTHER)
Admission: EM | Admit: 2017-03-19 | Discharge: 2017-03-20 | Disposition: A | Discharge status: Home / Self Care | Payer: Worker's Compensation | Attending: Emergency Medicine | Admitting: Emergency Medicine

## 2017-03-19 DIAGNOSIS — Z87891 Personal history of nicotine dependence: Secondary | ICD-10-CM | POA: Diagnosis not present

## 2017-03-19 DIAGNOSIS — M25561 Pain in right knee: Secondary | ICD-10-CM | POA: Diagnosis not present

## 2017-03-19 MED ORDER — NAPROXEN 500 MG PO TABS: 500.0000 mg | ORAL_TABLET | Freq: Two times a day (BID) | ORAL | 0 refills | Status: DC

## 2017-03-19 MED ORDER — NAPROXEN 250 MG PO TABS
500.0000 mg | ORAL_TABLET | Freq: Once | ORAL | Status: AC
Start: 2017-03-19 — End: 2017-03-20
  Administered 2017-03-20: 500 mg via ORAL
  Filled 2017-03-19: qty 2
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
## 2017-03-19 NOTE — Discharge Instructions (Signed)
Take the medications as needed for pain and inflammation, follow-up with Dr. Pearletha Forge

## 2017-03-20 NOTE — ED Notes (Signed)
Pt and SO given d/c instructions as per chart. Rx x 1. Verbalizes understanding. No questions. 

## 2017-03-23 ENCOUNTER — Encounter: Payer: Self-pay | Admitting: Family Medicine

## 2017-03-23 ENCOUNTER — Ambulatory Visit (INDEPENDENT_AMBULATORY_CARE_PROVIDER_SITE_OTHER): Payer: Worker's Compensation | Admitting: Family Medicine

## 2017-03-23 DIAGNOSIS — S8991XD Unspecified injury of right lower leg, subsequent encounter: Secondary | ICD-10-CM

## 2017-03-23 NOTE — Patient Instructions (Signed)
Take naproxen if pain recurs. Icing 15 minutes at a time 3-4 times a day if needed. Take a picture of this if it happens again and call me. Keep your regularly scheduled appointment with me next month otherwise. I'm a little leery about clearing you at that time if you're still having trouble but we will discuss then.

## 2017-03-24 NOTE — Assessment & Plan Note (Signed)
MRI showed a small 6mm osteochondral defect lateral femoral condyle with edema.  Possibly flare of this from a lot of activity over the weekend.  Has improved with naproxen - advised to start taking again if this recurs.  Icing if needed.  Keep regular f/u appt in 1 month for reevaluation.
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
## 2017-04-18 ENCOUNTER — Ambulatory Visit (INDEPENDENT_AMBULATORY_CARE_PROVIDER_SITE_OTHER): Payer: Worker's Compensation | Admitting: Family Medicine

## 2017-04-18 ENCOUNTER — Encounter: Payer: Self-pay | Admitting: Family Medicine

## 2017-04-18 DIAGNOSIS — S8991XD Unspecified injury of right lower leg, subsequent encounter: Secondary | ICD-10-CM | POA: Diagnosis not present

## 2017-04-18 NOTE — Patient Instructions (Signed)
At this point we need to refer you to an orthopedic surgeon to discuss possible microfracture surgery of the osteochondral defect. Naproxen twice a day if needed with food. Icing 15 minutes at a time 3-4 times a day if needed. Follow up with me as needed.

## 2017-04-19 NOTE — Assessment & Plan Note (Signed)
MRI showed a small 6mm osteochondral defect lateral femoral condyle with edema.  Despite conservative treatment - exercises, nsaids, icing, trial of injection - she has continued to have problems with knee.  Will refer to ortho for consideration of microfracture surgery.  Icing, naproxen if needed in meantime.
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
## 2017-04-21 NOTE — Addendum Note (Signed)
Addended by: Kathi SimpersWISE, Shyler Hamill F on: 04/21/2017 01:24 PM   Modules accepted: Orders
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
## 2020-09-05 ENCOUNTER — Emergency Department (HOSPITAL_BASED_OUTPATIENT_CLINIC_OR_DEPARTMENT_OTHER)
Admission: EM | Admit: 2020-09-05 | Discharge: 2020-09-05 | Disposition: A | Discharge status: Home / Self Care | Payer: Self-pay | Attending: Emergency Medicine | Admitting: Emergency Medicine

## 2020-09-05 ENCOUNTER — Encounter (HOSPITAL_BASED_OUTPATIENT_CLINIC_OR_DEPARTMENT_OTHER): Payer: Self-pay

## 2020-09-05 ENCOUNTER — Other Ambulatory Visit: Payer: Self-pay

## 2020-09-05 DIAGNOSIS — K5900 Constipation, unspecified: Secondary | ICD-10-CM | POA: Insufficient documentation

## 2020-09-05 DIAGNOSIS — X500XXA Overexertion from strenuous movement or load, initial encounter: Secondary | ICD-10-CM | POA: Insufficient documentation

## 2020-09-05 DIAGNOSIS — Z87891 Personal history of nicotine dependence: Secondary | ICD-10-CM | POA: Insufficient documentation

## 2020-09-05 DIAGNOSIS — Y93E2 Activity, laundry: Secondary | ICD-10-CM | POA: Insufficient documentation

## 2020-09-05 DIAGNOSIS — Z96651 Presence of right artificial knee joint: Secondary | ICD-10-CM | POA: Insufficient documentation

## 2020-09-05 DIAGNOSIS — M545 Low back pain, unspecified: Secondary | ICD-10-CM | POA: Insufficient documentation

## 2020-09-05 LAB — PREGNANCY, URINE: Preg Test, Ur: NEGATIVE

## 2020-09-05 MED ORDER — HYDROCODONE-ACETAMINOPHEN 5-325 MG PO TABS
1.0000 | ORAL_TABLET | Freq: Once | ORAL | Status: AC
  Administered 2020-09-05: 1 via ORAL
  Filled 2020-09-05: qty 1

## 2020-09-05 MED ORDER — HYDROCODONE-ACETAMINOPHEN 5-325 MG PO TABS: 1.0000 | ORAL_TABLET | ORAL | 0 refills | Status: DC | PRN

## 2020-09-05 NOTE — ED Triage Notes (Signed)
Bent over to pick up laundry Sunday, left lower back "locked up" and is now radiating to left flank/abd area. Ibuprofen without relief. Also on menstrual cycle.

## 2020-09-05 NOTE — ED Provider Notes (Addendum)
[facility] Provider Note: Lowella Dell, MD, FACEP  CSN: [id number] MRN: [id number] ARRIVAL: 09/05/20 at 0117 ROOM: [facility]01/[facility]   CHIEF COMPLAINT  Back Pain   HISTORY OF PRESENT ILLNESS  09/05/20 3:34 AM Jody Mclaughlin is a 39 y.o. female who bent over to pick up her laundry 5 days ago and felt her left lower back "locked up".  She is having pain in her mid lower back radiating to her left paralumbar region.  She has taken ibuprofen without relief.  She rates her pain is a 9 out of 10, sharp in nature and worse with movement.  She denies any numbness or weakness with this.  She is having no saddle anesthesia or urinary changes but is currently constipated.  She is also on her menses.   History reviewed. No pertinent past medical history.  Past Surgical History:  Procedure Laterality Date  . CESAREAN SECTION    . CESAREAN SECTION    . KNEE ARTHROPLASTY Right     History reviewed. No pertinent family history.  Social History   Tobacco Use  . Smoking status: Former Games developer  . Smokeless tobacco: Never Used  Substance Use Topics  . Alcohol use: No  . Drug use: No    Prior to Admission medications   Medication Sig Start Date End Date Taking? Authorizing Provider  HYDROcodone-acetaminophen (NORCO) 5-325 MG tablet Take 1 tablet by mouth every 4 (four) hours as needed for severe pain. 09/05/20   Gradie Butrick, MD    Allergies Patient has no known allergies.   REVIEW OF SYSTEMS  Negative except as noted here or in the History of Present Illness.   PHYSICAL EXAMINATION  Initial Vital Signs Blood pressure (!) 145/97, pulse 85, temperature 98.1 F (36.7 C), temperature source Oral, resp. rate 16, height 5\' 8"  (1.727 m), weight (!) 145.2 kg, SpO2 100 %.  Examination General: Well-developed, well-nourished female in no acute distress; appearance consistent with age of record HENT: normocephalic; atraumatic Eyes: Normal appearance Neck: supple Heart: regular rate  and rhythm Lungs: clear to auscultation bilaterally Abdomen: soft; nondistended; nontender; bowel sounds present Back: Nontender; pain with certain movements Extremities: No deformity; full range of motion Neurologic: Awake, alert and oriented; motor function intact in all extremities and symmetric; no facial droop; normal coordination and gait Skin: Warm and dry Psychiatric: Normal mood and affect   RESULTS  Summary of this visit's results, reviewed and interpreted by myself:   EKG Interpretation  Date/Time:    Ventricular Rate:    PR Interval:    QRS Duration:   QT Interval:    QTC Calculation:   R Axis:     Text Interpretation:        Laboratory Studies: Results for orders placed or performed during the hospital encounter of 09/05/20 (from the past 24 hour(s))  Pregnancy, urine     Status: None   Collection Time: 09/05/20  1:41 AM  Result Value Ref Range   Preg Test, Ur NEGATIVE NEGATIVE   Imaging Studies: No results found.  ED COURSE and MDM  Nursing notes, initial and subsequent vitals signs, including pulse oximetry, reviewed and interpreted by myself.  Vitals:   09/05/20 0134  BP: (!) 145/97  Pulse: 85  Resp: 16  Temp: 98.1 F (36.7 C)  TempSrc: Oral  SpO2: 100%  Weight: (!) 145.2 kg  Height: 5\' 8"  (1.727 m)   Medications  HYDROcodone-acetaminophen (NORCO/VICODIN) 5-325 MG per tablet 1 tablet (has no administration in time range)  We will try a short course of hydrocodone in addition to her current ibuprofen.  Patient was advised that back pain tends to be short-lived but if it persists she may need to follow-up with her PCP.  PROCEDURES  Procedures   ED DIAGNOSES     ICD-10-CM   1. Acute left-sided low back pain without sciatica  M54.50   2. Injury caused by lifting, initial encounter  X50.Olin Hauser, MD 09/05/20 0349    Paula Libra, MD 09/05/20 [phone]

## 2021-10-31 ENCOUNTER — Emergency Department (HOSPITAL_BASED_OUTPATIENT_CLINIC_OR_DEPARTMENT_OTHER)
Admission: EM | Admit: 2021-10-31 | Discharge: 2021-10-31 | Disposition: A | Discharge status: Home / Self Care | Payer: Self-pay | Attending: Emergency Medicine | Admitting: Emergency Medicine

## 2021-10-31 ENCOUNTER — Other Ambulatory Visit: Payer: Self-pay

## 2021-10-31 DIAGNOSIS — Z87891 Personal history of nicotine dependence: Secondary | ICD-10-CM | POA: Insufficient documentation

## 2021-10-31 DIAGNOSIS — W458XXA Other foreign body or object entering through skin, initial encounter: Secondary | ICD-10-CM | POA: Insufficient documentation

## 2021-10-31 DIAGNOSIS — S90851A Superficial foreign body, right foot, initial encounter: Secondary | ICD-10-CM | POA: Insufficient documentation

## 2021-10-31 MED ORDER — LIDOCAINE-EPINEPHRINE (PF) 2 %-1:200000 IJ SOLN
10.0000 mL | Freq: Once | INTRAMUSCULAR | Status: AC
  Administered 2021-10-31: 10 mL
  Filled 2021-10-31: qty 20

## 2021-10-31 MED ORDER — PENTAFLUOROPROP-TETRAFLUOROETH EX AERO
INHALATION_SPRAY | CUTANEOUS | Status: DC | PRN
  Filled 2021-10-31: qty 30

## 2021-10-31 NOTE — ED Notes (Signed)
Splinter removed by Dr. Anitra Lauth, pt tolerated well.  Area cleaned and bandage applied.
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
## 2021-10-31 NOTE — Discharge Instructions (Signed)
You can wash the area with soap and water.  Just make sure you have a bandage or something on it before you put your socks on for the next 2 days.  Tylenol or ibuprofen as needed for pain

## 2021-10-31 NOTE — ED Triage Notes (Signed)
Splinter from piece of plywood stuck in heel of foot this a.m. NAD

## 2023-11-10 ENCOUNTER — Encounter (HOSPITAL_BASED_OUTPATIENT_CLINIC_OR_DEPARTMENT_OTHER): Payer: Self-pay | Admitting: Emergency Medicine

## 2023-11-10 ENCOUNTER — Ambulatory Visit (HOSPITAL_BASED_OUTPATIENT_CLINIC_OR_DEPARTMENT_OTHER)
Admission: EM | Admit: 2023-11-10 | Discharge: 2023-11-10 | Disposition: A | Discharge status: Home / Self Care | Payer: Self-pay | Attending: Internal Medicine | Admitting: Internal Medicine

## 2023-11-10 DIAGNOSIS — S39012A Strain of muscle, fascia and tendon of lower back, initial encounter: Secondary | ICD-10-CM

## 2023-11-10 MED ORDER — CYCLOBENZAPRINE HCL 10 MG PO TABS: 10.0000 mg | ORAL_TABLET | Freq: Three times a day (TID) | ORAL | 0 refills | Status: AC | PRN

## 2023-11-10 NOTE — Discharge Instructions (Signed)
Continue the Naprosyn twice daily with food as needed for pain. Ice application as discussed for next 24 hours and switch to moist heat.  Recommend gentle stretching.  Do not drive operate machinery if taking the Flexeril/cyclobenzaprine. Follow-up with your primary care doctor or occupational health Monday

## 2023-11-10 NOTE — ED Triage Notes (Signed)
Took Naproxen and tried heat with no relief. Was getting out of a van on hill yesterday when stepped down wrong and hurt lower back.
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
## 2025-01-02 ENCOUNTER — Encounter (HOSPITAL_BASED_OUTPATIENT_CLINIC_OR_DEPARTMENT_OTHER): Payer: Self-pay

## 2025-01-02 ENCOUNTER — Other Ambulatory Visit (HOSPITAL_BASED_OUTPATIENT_CLINIC_OR_DEPARTMENT_OTHER): Payer: Self-pay

## 2025-01-02 ENCOUNTER — Ambulatory Visit (HOSPITAL_BASED_OUTPATIENT_CLINIC_OR_DEPARTMENT_OTHER)
Admission: EM | Admit: 2025-01-02 | Discharge: 2025-01-02 | Disposition: A | Discharge status: Home / Self Care | Attending: Family Medicine | Admitting: Family Medicine

## 2025-01-02 DIAGNOSIS — J069 Acute upper respiratory infection, unspecified: Secondary | ICD-10-CM | POA: Diagnosis not present

## 2025-01-02 DIAGNOSIS — R5383 Other fatigue: Secondary | ICD-10-CM | POA: Diagnosis not present

## 2025-01-02 MED ORDER — PROMETHAZINE-DM 6.25-15 MG/5ML PO SYRP
5.0000 mL | ORAL_SOLUTION | Freq: Four times a day (QID) | ORAL | 0 refills | Status: AC | PRN
  Filled 2025-01-02: qty 118, 6d supply, fill #0

## 2025-01-02 NOTE — ED Provider Notes (Signed)
 " [facility]    CSN: [id number] Arrival date & time: 01/02/25  1012      History   Chief Complaint Chief Complaint  Patient presents with   Cough    HPI Jody Mclaughlin is a 44 y.o. female.   44 year old female with complaint of cough, nasal congestion, headache, left ear pain, fatigue, upper back pain and some chest tightness that started on 12/28/2024.  She has used OTC Mucinex with little or no relief.  She denies fever, nausea, vomiting, constipation, diarrhea.   Cough Associated symptoms: ear pain, headaches, rhinorrhea and sore throat   Associated symptoms: no chest pain, no chills, no fever, no rash and no shortness of breath     History reviewed. No pertinent past medical history.  Patient Active Problem List   Diagnosis Date Noted   Right knee injury 08/17/2016    Past Surgical History:  Procedure Laterality Date   CESAREAN SECTION     CESAREAN SECTION     KNEE ARTHROPLASTY Right     OB History   No obstetric history on file.      Home Medications    Prior to Admission medications  Medication Sig Start Date End Date Taking? Authorizing Provider  promethazine -dextromethorphan (PROMETHAZINE -DM) 6.25-15 MG/5ML syrup Take 5 mLs by mouth 4 (four) times daily as needed for cough. Do not use and drive - May make drowsy. 01/02/25  Yes Ival Domino, FNP    Family History History reviewed. No pertinent family history.  Social History Social History[1]   Allergies   Patient has no known allergies.   Review of Systems Review of Systems  Constitutional:  Positive for fatigue. Negative for chills and fever.  HENT:  Positive for congestion, ear pain, postnasal drip, rhinorrhea and sore throat.   Eyes:  Negative for pain and visual disturbance.  Respiratory:  Positive for cough and chest tightness. Negative for shortness of breath.   Cardiovascular:  Negative for chest pain and palpitations.  Gastrointestinal:  Negative for abdominal pain,  constipation, diarrhea, nausea and vomiting.  Genitourinary:  Negative for dysuria and hematuria.  Musculoskeletal:  Positive for arthralgias. Negative for back pain.  Skin:  Negative for color change and rash.  Neurological:  Positive for headaches. Negative for seizures and syncope.  All other systems reviewed and are negative.    Physical Exam Triage Vital Signs ED Triage Vitals  Encounter Vitals Group     BP 01/02/25 1124 (!) 148/78     Girls Systolic BP Percentile --      Girls Diastolic BP Percentile --      Boys Systolic BP Percentile --      Boys Diastolic BP Percentile --      Pulse Rate 01/02/25 1124 81     Resp 01/02/25 1124 20     Temp 01/02/25 1124 98.2 F (36.8 C)     Temp Source 01/02/25 1124 Oral     SpO2 01/02/25 1124 95 %     Weight --      Height --      Head Circumference --      Peak Flow --      Pain Score 01/02/25 1123 2     Pain Loc --      Pain Education --      Exclude from Growth Chart --    No data found.  Updated Vital Signs BP (!) 148/78 (BP Location: Right Arm)   Pulse 81   Temp 98.2 F (36.8 C) (  Oral)   Resp 20   LMP 12/20/2024 (Exact Date)   SpO2 95%   Visual Acuity Right Eye Distance:   Left Eye Distance:   Bilateral Distance:    Right Eye Near:   Left Eye Near:    Bilateral Near:     Physical Exam Vitals and nursing note reviewed.  Constitutional:      General: She is not in acute distress.    Appearance: She is well-developed. She is not ill-appearing, toxic-appearing or diaphoretic.  HENT:     Head: Normocephalic and atraumatic.     Right Ear: Hearing, tympanic membrane, ear canal and external ear normal.     Left Ear: Hearing, tympanic membrane, ear canal and external ear normal.     Nose: Congestion and rhinorrhea present. Rhinorrhea is clear.     Right Sinus: No maxillary sinus tenderness or frontal sinus tenderness.     Left Sinus: No maxillary sinus tenderness or frontal sinus tenderness.     Mouth/Throat:      Lips: Pink.     Mouth: Mucous membranes are moist.     Pharynx: Uvula midline. No oropharyngeal exudate or posterior oropharyngeal erythema.     Tonsils: No tonsillar exudate.  Eyes:     Conjunctiva/sclera: Conjunctivae normal.     Pupils: Pupils are equal, round, and reactive to light.  Cardiovascular:     Rate and Rhythm: Normal rate and regular rhythm.     Heart sounds: S1 normal and S2 normal. No murmur heard. Pulmonary:     Effort: Pulmonary effort is normal. No respiratory distress.     Breath sounds: Normal breath sounds. No decreased breath sounds, wheezing, rhonchi or rales.  Abdominal:     General: Bowel sounds are normal.     Palpations: Abdomen is soft.     Tenderness: There is no abdominal tenderness.  Musculoskeletal:        General: No swelling.     Cervical back: Neck supple.  Lymphadenopathy:     Head:     Right side of head: No submental, submandibular, tonsillar, preauricular or posterior auricular adenopathy.     Left side of head: No submental, submandibular, tonsillar, preauricular or posterior auricular adenopathy.     Cervical: No cervical adenopathy.     Right cervical: No superficial cervical adenopathy.    Left cervical: No superficial cervical adenopathy.  Skin:    General: Skin is warm and dry.     Capillary Refill: Capillary refill takes less than 2 seconds.     Findings: No rash.  Neurological:     Mental Status: She is alert and oriented to person, place, and time.  Psychiatric:        Mood and Affect: Mood normal.      UC Treatments / Results  Labs (all labs ordered are listed, but only abnormal results are displayed) Labs Reviewed - No data to display  EKG   Radiology No results found.  Procedures Procedures (including critical care time)  Medications Ordered in UC Medications - No data to display  Initial Impression / Assessment and Plan / UC Course  I have reviewed the triage vital signs and the nursing notes.  Pertinent  labs & imaging results that were available during my care of the patient were reviewed by me and considered in my medical decision making (see chart for details).  Plan of Care (see discharge instructions for additional patient precautions and education): Viral upper respiratory infection with cough and fatigue (possible influenza):  Influenza testing not available onsite today.  Symptoms are potentially consistent with influenza but beyond the window of time to benefit from Tamiflu treatment.  Promethazine  DM, 5 mL, every 6 hours if needed for cough.  Get plenty of fluids and rest.  Work excuse provided.  Follow-up if symptoms do not improve, worsen or new symptoms occur.  I reviewed the plan of care with the patient and/or the patient's guardian.  The patient and/or guardian had time to ask questions and acknowledged that the questions were answered.  Final Clinical Impressions(s) / UC Diagnoses   Final diagnoses:  Viral URI with cough  Other fatigue     Discharge Instructions      Viral upper respiratory infection with cough and fatigue (possible influenza): Influenza testing not available onsite today.  Symptoms are potentially consistent with influenza but beyond the window of time to benefit from Tamiflu treatment.  Promethazine  DM, 5 mL, every 6 hours if needed for cough.  Get plenty of fluids and rest.  Work excuse provided.  Follow-up if symptoms do not improve, worsen or new symptoms occur.     ED Prescriptions     Medication Sig Dispense Auth. Provider   promethazine -dextromethorphan (PROMETHAZINE -DM) 6.25-15 MG/5ML syrup Take 5 mLs by mouth 4 (four) times daily as needed for cough. Do not use and drive - May make drowsy. 118 mL Ival Domino, FNP      PDMP not reviewed this encounter.    [1]  Social History Tobacco Use   Smoking status: Former   Smokeless tobacco: Never  Substance Use Topics   Alcohol use: No   Drug use: No     Ival Domino, FNP 01/02/25  1304  "

## 2025-01-02 NOTE — Medical Student Note (Signed)
 "  [facility] Provider Student Note For educational purposes for Medical, PA and NP students only and not part of the legal medical record.   CSN: [id number] Arrival date & time: 01/02/25  1012      History   Chief Complaint Chief Complaint  Patient presents with   Cough    HPI Jody Mclaughlin is a 43 y.o. female with a non-contributory medical history.  She presents with 5 days of dry cough, fatigue, nasal congestion, and body aches. She also reports some intermittent chest tightness after coughing fits. She denies fever, chills, N/V, or ShOB. 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 She has received her flu vaccine for the season.    Cough Associated symptoms: myalgias and rhinorrhea   Associated symptoms: no chest pain, no chills, no fever, no headaches, no shortness of breath, no sore throat and no wheezing     History reviewed. No pertinent past medical history.  Patient Active Problem List   Diagnosis Date Noted   Right knee injury 08/17/2016    Past Surgical History:  Procedure Laterality Date   CESAREAN SECTION     CESAREAN SECTION     KNEE ARTHROPLASTY Right     OB History   No obstetric history on file.      Home Medications    Prior to Admission medications  Not on File    Family History History reviewed. No pertinent family history.  Social History Social History[1]   Allergies   Patient has no known allergies.   Review of Systems Review of Systems  Constitutional:  Negative for chills and fever.  HENT:  Positive for rhinorrhea. Negative for sinus pressure and sore throat.   Respiratory:  Positive for cough and chest tightness. Negative for shortness of breath and wheezing.   Cardiovascular:  Negative for chest pain.  Gastrointestinal:  Negative for abdominal pain, diarrhea, nausea and vomiting.  Musculoskeletal:  Positive for myalgias.  Neurological:  Negative for headaches.     Physical  Exam Updated Vital Signs BP (!) 148/78 (BP Location: Right Arm)   Pulse 81   Temp 98.2 F (36.8 C) (Oral)   Resp 20   LMP 12/20/2024 (Exact Date)   SpO2 95%   Physical Exam Vitals and nursing note reviewed.  Constitutional:      Appearance: Normal appearance. She is not ill-appearing.  HENT:     Right Ear: Tympanic membrane, ear canal and external ear normal.     Left Ear: Tympanic membrane, ear canal and external ear normal.     Nose: Rhinorrhea present.     Mouth/Throat:     Mouth: Mucous membranes are moist.     Pharynx: Oropharynx is clear. No oropharyngeal exudate or posterior oropharyngeal erythema.  Eyes:     Conjunctiva/sclera: Conjunctivae normal.     Pupils: Pupils are equal, round, and reactive to light.  Cardiovascular:     Rate and Rhythm: Normal rate and regular rhythm.     Heart sounds: Normal heart sounds. No murmur heard.    No friction rub. No gallop.  Pulmonary:     Effort: Pulmonary effort is normal.     Breath sounds: Normal breath sounds.  Abdominal:     General: Bowel sounds are normal.     Palpations: Abdomen is soft.     Tenderness: There is no abdominal tenderness.  Musculoskeletal:     Cervical back: Neck supple.  Lymphadenopathy:  Cervical: No cervical adenopathy.  Neurological:     Mental Status: She is alert and oriented to person, place, and time.  Psychiatric:        Mood and Affect: Mood normal.        Behavior: Behavior normal.      ED Treatments / Results  Labs (all labs ordered are listed, but only abnormal results are displayed) Labs Reviewed - No data to display  EKG  Radiology No results found.  Procedures Procedures (including critical care time)  Medications Ordered in ED Medications - No data to display   Initial Impression / Assessment and Plan / ED Course  I have reviewed the triage vital signs and the nursing notes.  Pertinent labs & imaging results that were available during my care of the patient  were reviewed by me and considered in my medical decision making (see chart for details).    Influenza-Like Illness  Symptoms consistent with flu-like illness. Flu testing not currently available on-site due to backorder of testing supplies. Will start promethazine -DM prn for cough. Counseled pt on aggressive hydration. Can use fluticasone nasal spray OTC as directed.   Red flag symptoms reviewed and return precautions given.    Final Clinical Impressions(s) / ED Diagnoses   Final diagnoses:  None    New Prescriptions New Prescriptions   No medications on file       [1]  Social History Tobacco Use   Smoking status: Former   Smokeless tobacco: Never  Substance Use Topics   Alcohol use: No   Drug use: No   "

## 2025-01-02 NOTE — ED Triage Notes (Signed)
 Pt c/o cough, fatigue, nasal congestion, HA, left ear pain, slight upper back pain, and occ chest tightness since Friday. She has taken mucinex with no relief.

## 2025-01-02 NOTE — Discharge Instructions (Addendum)
 Viral upper respiratory infection with cough and fatigue (possible influenza): Influenza testing not available onsite today.  Symptoms are potentially consistent with influenza but beyond the window of time to benefit from Tamiflu treatment.  Promethazine  DM, 5 mL, every 6 hours if needed for cough.  Get plenty of fluids and rest.  Work excuse provided.  Follow-up if symptoms do not improve, worsen or new symptoms occur.
